# Patient Record
Sex: Female | Born: 2000 | Race: White | Hispanic: Yes | Marital: Single | Smoking: Never smoker
Health system: Southern US, Community
[De-identification: ages and names within clinical notes are randomized; demographics above are authoritative.]

## PROBLEM LIST (undated history)

## (undated) DIAGNOSIS — D649 Anemia, unspecified: Secondary | ICD-10-CM

## (undated) HISTORY — PX: NO PAST SURGERIES: SHX2092

---
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
## 2021-03-23 ENCOUNTER — Encounter: Payer: Self-pay | Admitting: General Surgery

## 2021-12-16 IMAGING — CT CT ABD-PELV W/ CM
2 of 4 series · 15 of 46 positions shown, 17 images · IV contrast (APPLIED)
Comparison: 09/25/2020 CT abdomen/pelvis.

CLINICAL DATA: Inpatient. Right lower quadrant pain. Suspected
perforated acute appendicitis on prior scan. Follow-up abscess.

EXAM:
CT ABDOMEN AND PELVIS WITH CONTRAST
TECHNIQUE: Multidetector CT imaging of the abdomen and pelvis was performed
using the standard protocol following bolus administration of
intravenous contrast.
CONTRAST:  100mL OMNIPAQUE IOHEXOL 300 MG/ML  SOLN

[Series 2: routine abd/pel with · axial · 0.88mm/px · z∈[-891,-501]mm · 12 of 92 slices shown, 14 images]
[im 7/92  soft-tissue]
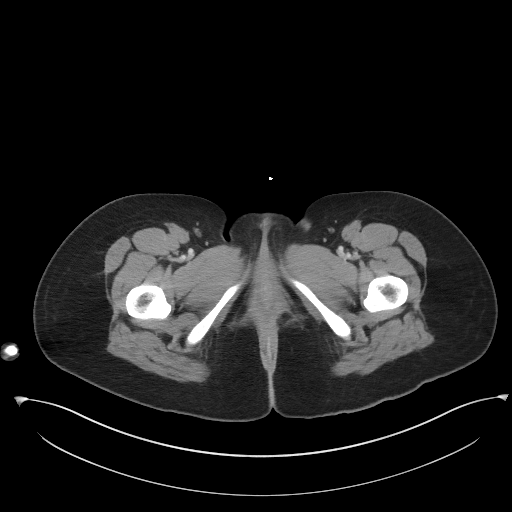
[im 7/92  bone]
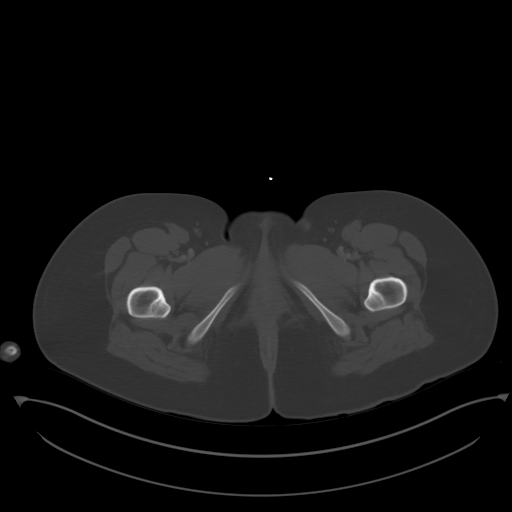
[im 13/92  soft-tissue]
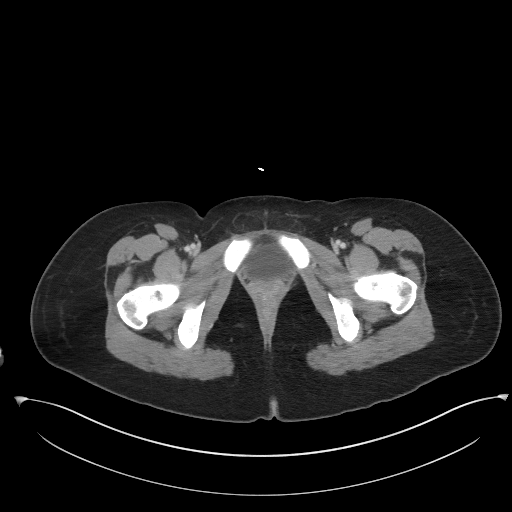
[im 22/92  soft-tissue]
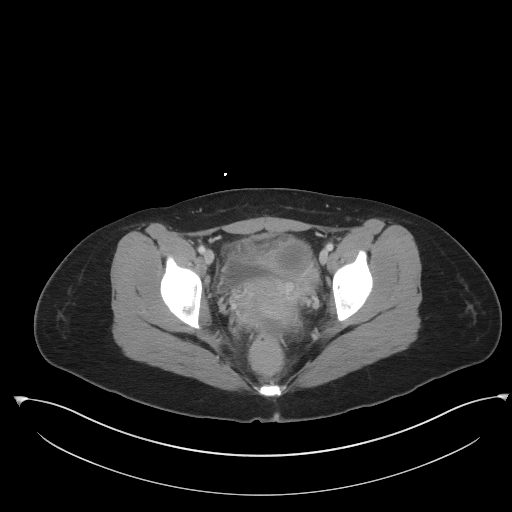
[im 28/92  soft-tissue]
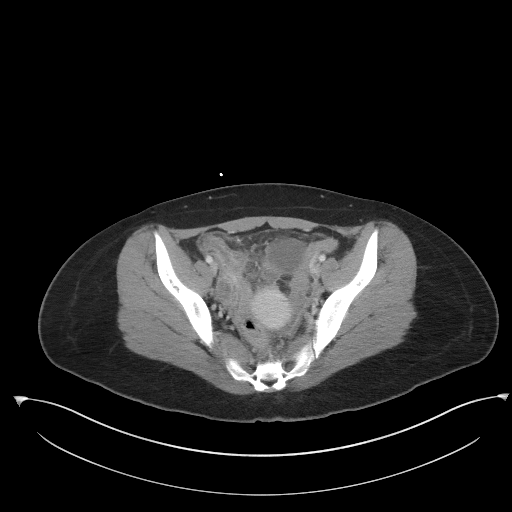
[im 34/92  soft-tissue]
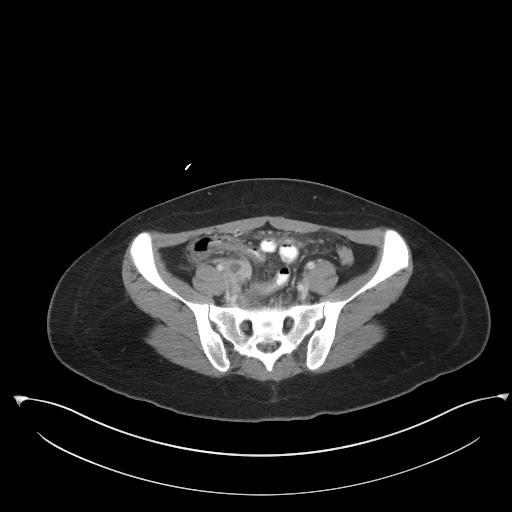
[im 43/92  soft-tissue]
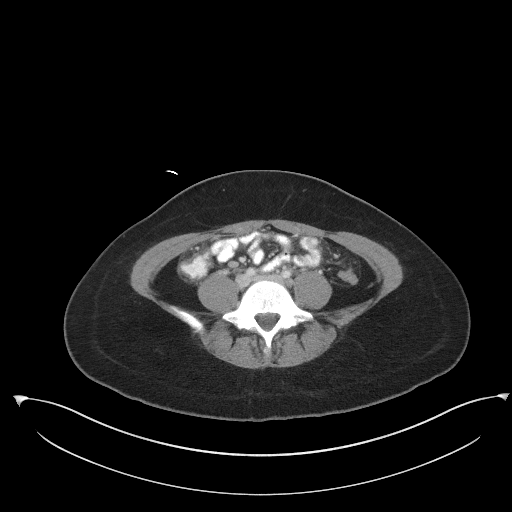
[im 49/92  soft-tissue]
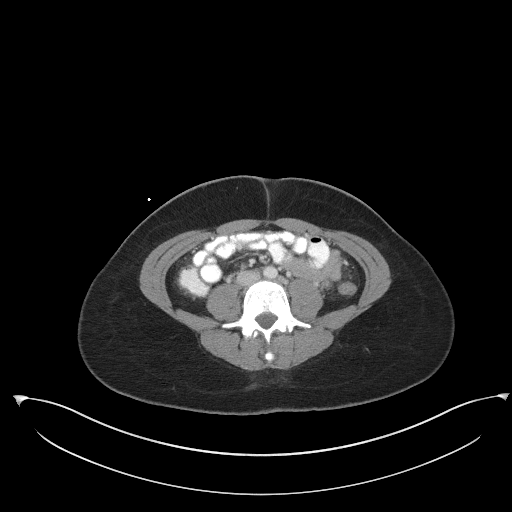
[im 58/92  soft-tissue]
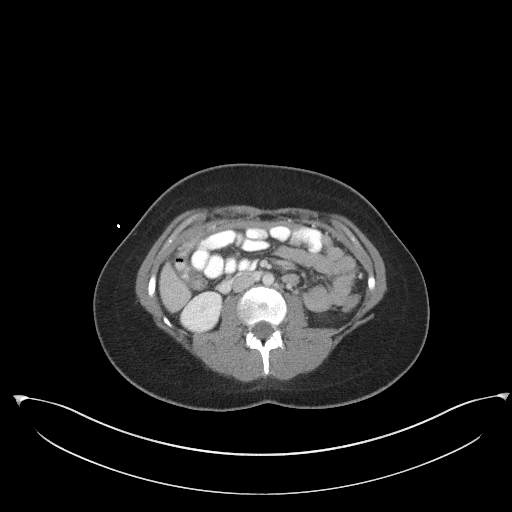
[im 64/92  soft-tissue]
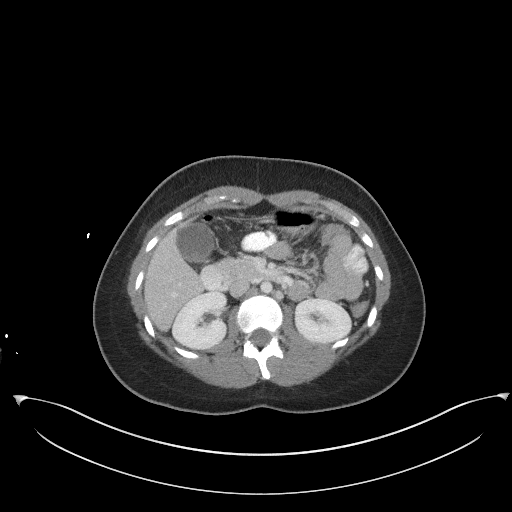
[im 64/92  bone]
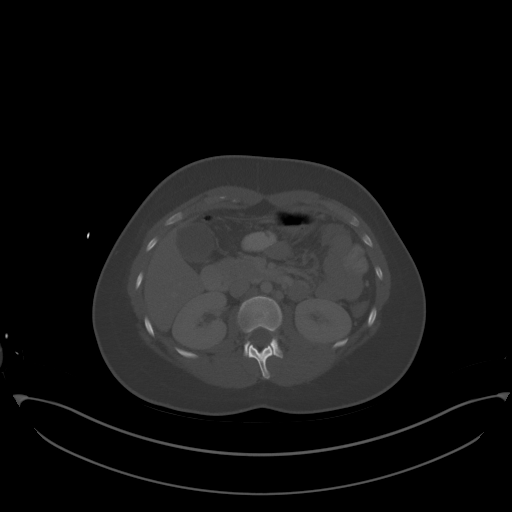
[im 70/92  soft-tissue]
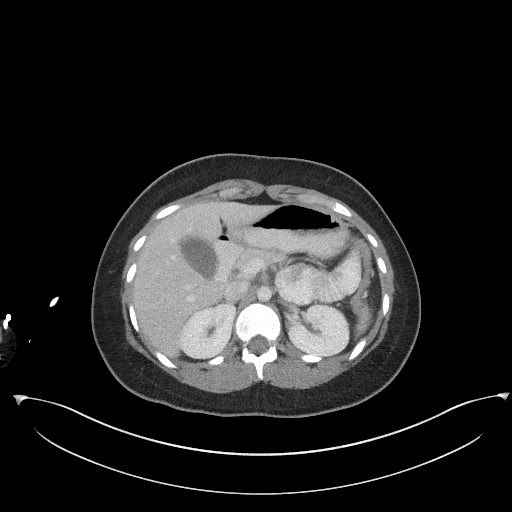
[im 79/92  soft-tissue]
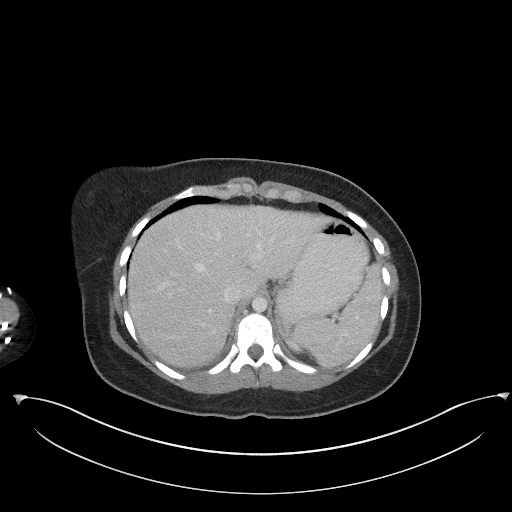
[im 85/92  soft-tissue]
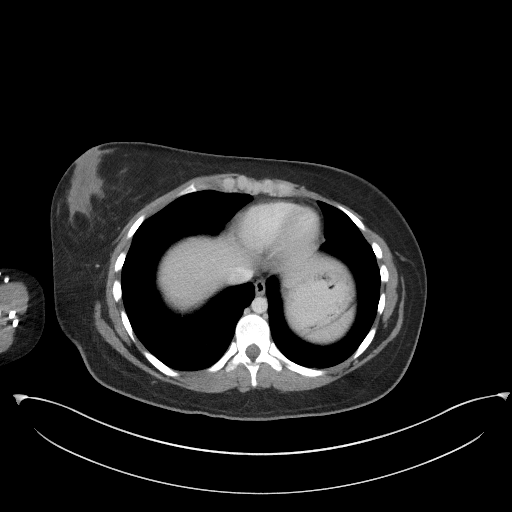

[Series 5: coronal st · coronal · 0.71mm/px · 3 of 86 slices shown]
[im 29/86  soft-tissue]
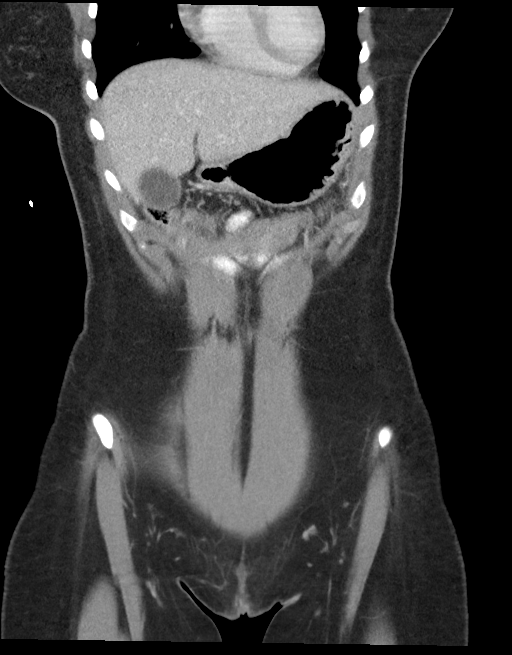
[im 38/86  soft-tissue]
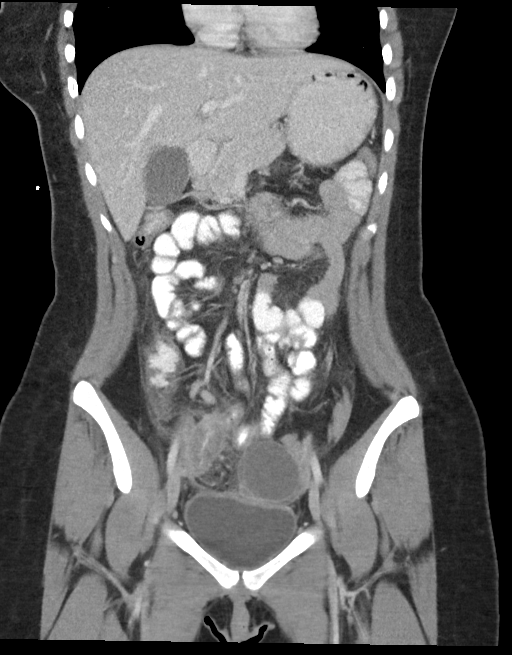
[im 48/86  soft-tissue]
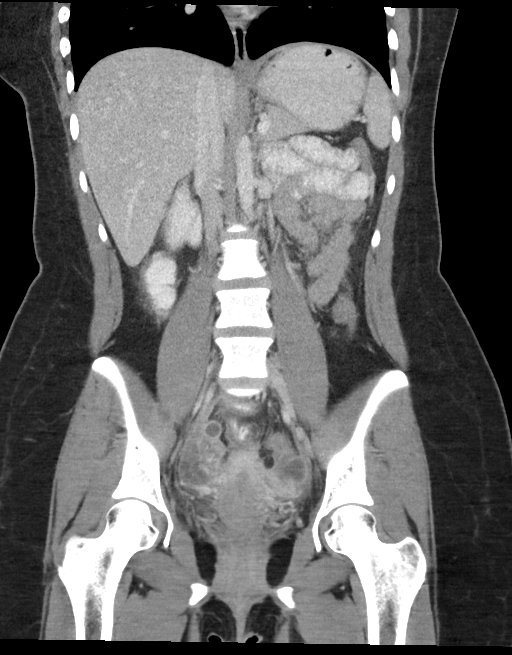

[15 of 46 positions shown; findings below may reference images not displayed]

FINDINGS: Lower chest: No significant pulmonary nodules or acute consolidative
airspace disease.

Hepatobiliary: Normal liver size. No liver mass. Normal gallbladder
with no radiopaque cholelithiasis. No biliary ductal dilatation.

Pancreas: Normal, with no mass or duct dilation.

Spleen: Normal size. No mass.

Adrenals/Urinary Tract: Normal adrenals. Normal kidneys with no
hydronephrosis and no renal mass. Normal bladder.

Stomach/Bowel: Normal non-distended stomach. No dilated small bowel
loops. Oral contrast transits to the right colon. Redemonstrated is
a dilated (13 mm diameter) tubular structure in the right lower
quadrant with indistinct thick enhancing wall and prominent
surrounding fat stranding, compatible with acute appendicitis, not
substantially changed. No free air. Reactive wall thickening within
multiple pelvic small bowel loops and throughout the cecum.
Otherwise unremarkable large bowel.

Vascular/Lymphatic: Normal caliber abdominal aorta. Patent portal,
splenic, hepatic and renal veins. No pathologically enlarged lymph
nodes in the abdomen or pelvis.

Reproductive: Grossly normal wall retroflexed uterus. No right
adnexal masses. Left adnexal 3.9 x 3.3 cm cyst (series 2/image 68),
minimally decreased from 4.2 x 3.7 cm on 09/25/2020 CT.

Other: No pneumoperitoneum. Pelvic cul-de-sac 5.8 x 2.6 cm
collection with thick enhancing wall (series 2/image 69), previously
5.5 x 2.6 cm, not substantially changed.

Musculoskeletal: No aggressive appearing focal osseous lesions.
IMPRESSION: 1. Persistent findings of acute appendicitis in the right lower
quadrant with prominent surrounding phlegmonous change including
reactive wall thickening in the pelvic small bowel and cecum.
Appendiceal wall is indistinct, suggesting ruptured appendicitis, as
previously suggested.
2. Pelvic cul-de-sac 5.8 x 2.6 cm collection with thick enhancing
wall, not substantially changed since 09/25/2020 CT, suspicious for
abscess.
3. Left adnexal 3.9 cm cyst, minimally decreased from 4.2 cm on
09/25/2020 CT.

## 2022-05-27 ENCOUNTER — Other Ambulatory Visit: Payer: Self-pay

## 2022-05-27 ENCOUNTER — Emergency Department
Admission: EM | Admit: 2022-05-27 | Discharge: 2022-05-27 | Disposition: A | Discharge status: Home / Self Care | Payer: Medicaid Other | Attending: Emergency Medicine | Admitting: Emergency Medicine

## 2022-05-27 ENCOUNTER — Emergency Department: Payer: Medicaid Other

## 2022-05-27 DIAGNOSIS — N200 Calculus of kidney: Secondary | ICD-10-CM | POA: Insufficient documentation

## 2022-05-27 DIAGNOSIS — R109 Unspecified abdominal pain: Secondary | ICD-10-CM | POA: Diagnosis present

## 2022-05-27 LAB — CBC
HCT: 42.2 % (ref 36.0–46.0)
Hemoglobin: 14.1 g/dL (ref 12.0–15.0)
MCH: 30.4 pg (ref 26.0–34.0)
MCHC: 33.4 g/dL (ref 30.0–36.0)
MCV: 90.9 fL (ref 80.0–100.0)
Platelets: 236 10*3/uL (ref 150–400)
RBC: 4.64 MIL/uL (ref 3.87–5.11)
RDW: 12.5 % (ref 11.5–15.5)
WBC: 10.8 10*3/uL — ABNORMAL HIGH (ref 4.0–10.5)
nRBC: 0 % (ref 0.0–0.2)

## 2022-05-27 LAB — URINALYSIS, COMPLETE (UACMP) WITH MICROSCOPIC
Bacteria, UA: NONE SEEN
Bilirubin Urine: NEGATIVE
Glucose, UA: NEGATIVE mg/dL
Ketones, ur: NEGATIVE mg/dL
Leukocytes,Ua: NEGATIVE
Nitrite: NEGATIVE
Protein, ur: NEGATIVE mg/dL
RBC / HPF: >50 RBC/hpf — ABNORMAL HIGH (ref 0–5)
Specific Gravity, Urine: 1.024 (ref 1.005–1.030)
pH: 7 (ref 5.0–8.0)

## 2022-05-27 LAB — URINALYSIS, ROUTINE W REFLEX MICROSCOPIC
Bilirubin Urine: NEGATIVE
Glucose, UA: NEGATIVE mg/dL
Ketones, ur: NEGATIVE mg/dL
Nitrite: NEGATIVE
Protein, ur: NEGATIVE mg/dL
Specific Gravity, Urine: 1.023 (ref 1.005–1.030)
Squamous Epithelial / HPF: >50 — ABNORMAL HIGH (ref 0–5)
pH: 5 (ref 5.0–8.0)

## 2022-05-27 LAB — COMPREHENSIVE METABOLIC PANEL
ALT: 14 U/L (ref 0–44)
AST: 25 U/L (ref 15–41)
Albumin: 4.1 g/dL (ref 3.5–5.0)
Alkaline Phosphatase: 65 U/L (ref 38–126)
Anion gap: 10 (ref 5–15)
BUN: 14 mg/dL (ref 6–20)
CO2: 24 mmol/L (ref 22–32)
Calcium: 9.3 mg/dL (ref 8.9–10.3)
Chloride: 107 mmol/L (ref 98–111)
Creatinine, Ser: 0.74 mg/dL (ref 0.44–1.00)
GFR, Estimated: >60 mL/min (ref >60)
Glucose, Bld: 125 mg/dL — ABNORMAL HIGH (ref 70–99)
Potassium: 3.8 mmol/L (ref 3.5–5.1)
Sodium: 141 mmol/L (ref 135–145)
Total Bilirubin: 0.4 mg/dL (ref 0.3–1.2)
Total Protein: 8 g/dL (ref 6.5–8.1)

## 2022-05-27 LAB — LIPASE, BLOOD: Lipase: 35 U/L (ref 11–51)

## 2022-05-27 LAB — POC URINE PREG, ED: Preg Test, Ur: NEGATIVE

## 2022-05-27 LAB — HCG, QUANTITATIVE, PREGNANCY: hCG, Beta Chain, Quant, S: 1 m[IU]/mL (ref <5)

## 2022-05-27 MED ORDER — HYDROCODONE-ACETAMINOPHEN 5-325 MG PO TABS: 1.0000 | ORAL_TABLET | Freq: Four times a day (QID) | ORAL | 0 refills | Status: DC | PRN

## 2022-05-27 MED ORDER — KETOROLAC TROMETHAMINE 15 MG/ML IJ SOLN
15.0000 mg | Freq: Once | INTRAMUSCULAR | Status: AC
  Administered 2022-05-27: 15 mg via INTRAVENOUS
  Filled 2022-05-27: qty 1

## 2022-05-27 MED ORDER — ACETAMINOPHEN 500 MG PO TABS
1000.0000 mg | ORAL_TABLET | Freq: Once | ORAL | Status: AC
  Administered 2022-05-27: 1000 mg via ORAL
  Filled 2022-05-27: qty 2

## 2022-05-27 MED ORDER — HYDROCODONE-ACETAMINOPHEN 5-325 MG PO TABS
1.0000 | ORAL_TABLET | Freq: Once | ORAL | Status: AC
  Administered 2022-05-27: 1 via ORAL
  Filled 2022-05-27: qty 1

## 2022-05-27 MED ORDER — SODIUM CHLORIDE 0.9 % IV BOLUS
1000.0000 mL | Freq: Once | INTRAVENOUS | Status: AC
  Administered 2022-05-27: 1000 mL via INTRAVENOUS

## 2022-05-27 MED ORDER — MORPHINE SULFATE (PF) 4 MG/ML IV SOLN
4.0000 mg | Freq: Once | INTRAVENOUS | Status: AC
  Administered 2022-05-27: 4 mg via INTRAVENOUS
  Filled 2022-05-27: qty 1

## 2022-05-27 MED ORDER — ONDANSETRON 4 MG PO TBDP: 4.0000 mg | ORAL_TABLET | Freq: Four times a day (QID) | ORAL | 1 refills | Status: DC | PRN

## 2022-05-27 MED ORDER — ONDANSETRON HCL 4 MG/2ML IJ SOLN
4.0000 mg | Freq: Once | INTRAMUSCULAR | Status: AC
  Administered 2022-05-27: 4 mg via INTRAVENOUS
  Filled 2022-05-27: qty 2

## 2022-05-27 NOTE — Discharge Instructions (Addendum)
You have been seen in the Emergency Department (ED) today for pain that we believe based on your workup, is caused by kidney stones.  As we have discussed, please drink plenty of fluids.  Please make a follow up appointment with the physician(s) listed elsewhere in this documentation. ° °You may take pain medication as needed but ONLY as prescribed.   We also recommend that you take over-the-counter ibuprofen regularly according to label instructions over the next 5 days.  Take it with meals to minimize stomach discomfort. ° °Please see your doctor as soon as possible as stones may take 1-3 weeks to pass and you may require additional care or medications. ° °Do not drink alcohol, drive or participate in any other potentially dangerous activities while taking opiate pain medication as it may make you sleepy. Do not take this medication with any other sedating medications, either prescription or over-the-counter. If you were prescribed Percocet or Vicodin, do not take these with acetaminophen (Tylenol) as it is already contained within these medications. °  °This medication is an opiate (or narcotic) pain medication and can be habit forming.  Use it as little as possible to achieve adequate pain control.  Do not use or use it with extreme caution if you have a history of opiate abuse or dependence.  If you are on a pain contract with your primary care doctor or a pain specialist, be sure to let them know you were prescribed this medication today from the [facility].  This medication is intended for your use only - do not give any to anyone else and keep it in a secure place where nobody else, especially children, have access to it.  It will also cause or worsen constipation, so you may want to consider taking an over-the-counter stool softener while you are taking this medication. ° °Return to the Emergency Department (ED) or call your doctor if you have any worsening pain, fever, painful  urination, are unable to urinate, or develop other symptoms that concern you. ° °

## 2022-05-27 NOTE — ED Provider Notes (Signed)
[facility] Provider Note   Event Date/Time   First MD Initiated Contact with Patient 05/27/22 1129     (approximate)  History   Abdominal Pain  HPI  Kimberly Park is a 21 y.o. female reports no major past medical history other than an appendectomy about 1 year ago.  Takes no medications no allergies to medications denies pregnancy  This morning about 8 AM suddenly had an abrupt pain in her left flank and urge to urinate.  Reports she is felt like she needed to urinate about 20 times since then is having of severe sharp pain does not seem to want to go away in the left flank region.  She reports her family has a strong history of family Of kidney stones but she does not  No pelvic pain or vaginal bleeding.  She reports the pain is more in her left back and flank area that is severe although relieved slightly by medication she was given at triage  Has had vomiting, she reports it hurts so bad is been causing her to throw up  No fevers or chills     Physical Exam   Triage Vital Signs: ED Triage Vitals  Enc Vitals Group     BP 05/27/22 1041 (!) 111/98     Pulse Rate 05/27/22 1039 97     Resp 05/27/22 1039 (!) 22     Temp 05/27/22 1039 97.6 F (36.4 C)     Temp Source 05/27/22 1039 Oral     SpO2 05/27/22 1039 100 %     Weight 05/27/22 1134 143 lb 4.8 oz (65 kg)     Height 05/27/22 1041 5' (1.524 m)     Head Circumference --      Peak Flow --      Pain Score 05/27/22 1041 10     Pain Loc --      Pain Edu? --      Excl. in GC? --     Most recent vital signs: Vitals:   05/27/22 1135 05/27/22 1454  BP: 120/88 122/80  Pulse: 90 88  Resp: 20 20  Temp:  98 F (36.7 C)  SpO2: 99% 99%     General: Awake, no distress.  CV:  Good peripheral perfusion.  Normal heart tones Resp:  Normal effort.  Clear bilateral Abd:  No distention.  Mild tenderness to palpation in the left flank.  No rebound or guarding.  Moderate to severe left  costovertebral angle tenderness to percussion. Other:     ED Results / Procedures / Treatments   Labs (all labs ordered are listed, but only abnormal results are displayed) Labs Reviewed  COMPREHENSIVE METABOLIC PANEL - Abnormal; Notable for the following components:      Result Value   Glucose, Bld 125 (*)    All other components within normal limits  CBC - Abnormal; Notable for the following components:   WBC 10.8 (*)    All other components within normal limits  URINALYSIS, ROUTINE W REFLEX MICROSCOPIC - Abnormal; Notable for the following components:   Color, Urine YELLOW (*)    APPearance CLOUDY (*)    Hgb urine dipstick MODERATE (*)    Leukocytes,Ua TRACE (*)    Bacteria, UA RARE (*)    Squamous Epithelial / LPF >50 (*)    All other components within normal limits  URINALYSIS, COMPLETE (UACMP) WITH MICROSCOPIC - Abnormal; Notable for the following components:   Color, Urine YELLOW (*)  APPearance CLEAR (*)    Hgb urine dipstick MODERATE (*)    RBC / HPF >50 (*)    All other components within normal limits  URINE CULTURE  LIPASE, BLOOD  HCG, QUANTITATIVE, PREGNANCY  POC URINE PREG, ED     EKG     RADIOLOGY  CT imaging reviewed by me for acute gross abnormality, noted is a small amount of left-sided hydronephrosis and a tiny calcification near the bladder  CT Renal Stone Study  Result Date: 05/27/2022 CLINICAL DATA:  Abdominal/flank pain on the left. EXAM: CT ABDOMEN AND PELVIS WITHOUT CONTRAST TECHNIQUE: Multidetector CT imaging of the abdomen and pelvis was performed following the standard protocol without IV contrast. RADIATION DOSE REDUCTION: This exam was performed according to the departmental dose-optimization program which includes automated exposure control, adjustment of the mA and/or kV according to patient size and/or use of iterative reconstruction technique. COMPARISON:  CT abdomen pelvis dated 11/18/2020 FINDINGS: Lower chest: No acute  abnormality. Hepatobiliary: No focal liver abnormality is seen. No gallstones, gallbladder wall thickening, or biliary dilatation. Pancreas: Unremarkable. No pancreatic ductal dilatation or surrounding inflammatory changes. Spleen: Normal in size without focal abnormality. Adrenals/Urinary Tract: There is a 3 mm calcification in the pelvis in the expected location of the distal left ureter (series 2, image 71 and series 5, image 62). There is mild left hydroureteronephrosis. A 10 x 4 mm calcification near the right psoas muscle is favored to be an appendicolith as opposed to a ureteral calculus (series 2, image 55 series 5 images 57-59). The right kidney appears normal and there is no hydronephrosis on the right. The urinary bladder appears normal. Stomach/Bowel: Stomach is within normal limits. Appendix appears normal. No evidence of bowel wall thickening, distention, or inflammatory changes. Vascular/Lymphatic: No significant vascular findings are present. No enlarged abdominal or pelvic lymph nodes. Reproductive: Uterus and bilateral adnexa are unremarkable. Other: There is a small amount of free fluid in the pelvis. No abdominal wall hernia is identified. Musculoskeletal: No acute or significant osseous findings. IMPRESSION: 1. A 3 mm calcification in the pelvis in the expected location of the distal left ureter with mild left hydroureteronephrosis. This is favored to reflect an obstructive distal left ureteral calculus. 2. Small amount of free fluid in the pelvis may be physiologic. Electronically Signed   By: Romona Curls M.D.   On: 05/27/2022 12:02      PROCEDURES:  Critical Care performed: No  Procedures   MEDICATIONS ORDERED IN ED: Medications  sodium chloride 0.9 % bolus 1,000 mL (0 mLs Intravenous Stopped 05/27/22 1447)  ondansetron (ZOFRAN) injection 4 mg (4 mg Intravenous Given 05/27/22 1051)  acetaminophen (TYLENOL) tablet 1,000 mg (1,000 mg Oral Given 05/27/22 1052)  ketorolac  (TORADOL) 15 MG/ML injection 15 mg (15 mg Intravenous Given 05/27/22 1141)  morphine (PF) 4 MG/ML injection 4 mg (4 mg Intravenous Given 05/27/22 1141)  HYDROcodone-acetaminophen (NORCO/VICODIN) 5-325 MG per tablet 1 tablet (1 tablet Oral Given 05/27/22 1332)  HYDROcodone-acetaminophen (NORCO/VICODIN) 5-325 MG per tablet 1 tablet (1 tablet Oral Given 05/27/22 1445)  ketorolac (TORADOL) 15 MG/ML injection 15 mg (15 mg Intravenous Given 05/27/22 1445)     IMPRESSION / MDM / ASSESSMENT AND PLAN / ED COURSE  I reviewed the triage vital signs and the nursing notes.                              Differential diagnosis includes but is not limited to,  abdominal perforation, aortic dissection, cholecystitis, appendicitis, diverticulitis, colitis, esophagitis/gastritis, kidney stone, pyelonephritis, urinary tract infection, aortic aneurysm. All are considered in decision and treatment plan. Based upon the patient's presentation and risk factors, suspect this may be acute nephrolithiasis or urologic process based on the clinical history, urinary urgency, severe left flank pain and costovertebral angle tenderness.  After pain control we will proceed with CT imaging.  Initial urine sample was quite contaminated, patient reports she did not catch a midstream sample.  Will have her trial attempt to catch a midstream cleaner sample for analysis.  Repeat urinalysis with RBCs but no evidence of infection   Patient's presentation is most consistent with acute complicated illness / injury requiring diagnostic workup.  Labs notable for very mild leukocytosis.  Normal comprehensive metabolic panel.  Negative pregnancy test.  ----------------------------------------- 1:19 PM on 05/27/2022 ----------------------------------------- Patient's pain much better controlled.  She is now ambulating able to give a second urine sample for analysis.  Discussed with her the diagnosis treatment follow-up with urology and  careful return and treatment precautions which she is agreeable with.  Her family will be driving her home she understands not to drive today or while taking hydrocodone Clinical Course as of 05/27/22 1528  Fri May 27, 2022  1440 Patient having increasing and recurrent pain, now appears to be screaming out reporting pain in the left flank area.  Pain is presently not well-controlled.  Will provide additional hydrocodone and additional dose of Toradol [MQ]    Clinical Course User Index [MQ] Sharyn Creamer, MD     FINAL CLINICAL IMPRESSION(S) / ED DIAGNOSES   Final diagnoses:  Kidney stone on left side     Rx / DC Orders   ED Discharge Orders          Ordered    HYDROcodone-acetaminophen (NORCO/VICODIN) 5-325 MG tablet  Every 6 hours PRN        05/27/22 1524    ondansetron (ZOFRAN-ODT) 4 MG disintegrating tablet  Every 6 hours PRN        05/27/22 1524             Note:  This document was prepared using Dragon voice recognition software and may include unintentional dictation errors.   Sharyn Creamer, MD 05/27/22 [phone]

## 2022-05-27 NOTE — ED Triage Notes (Addendum)
Pt to ED via POV from home. Pt reports left sided abdominal pain that started this morning. Pt also reports difficulty urinating and pain. Pt reports appendix removed last year. Pt tearful and guarding left side. Pt unable to sit still in triage due to pain.

## 2022-05-29 LAB — URINE CULTURE: Culture: <10000 — AB

## 2022-06-03 ENCOUNTER — Ambulatory Visit: Payer: Medicaid Other | Admitting: Urology

## 2022-06-03 ENCOUNTER — Encounter: Payer: Self-pay | Admitting: Urology

## 2022-06-03 ENCOUNTER — Ambulatory Visit
Admission: RE | Admit: 2022-06-03 | Discharge: 2022-06-03 | Disposition: A | Discharge status: Home / Self Care | Payer: Medicaid Other | Source: Ambulatory Visit | Attending: Urology | Admitting: Urology

## 2022-06-03 VITALS — BP 104/68 | HR 83 | Ht 60.0 in | Wt 141.0 lb

## 2022-06-03 DIAGNOSIS — N201 Calculus of ureter: Secondary | ICD-10-CM

## 2022-06-03 DIAGNOSIS — N200 Calculus of kidney: Secondary | ICD-10-CM | POA: Insufficient documentation

## 2022-06-03 LAB — URINALYSIS, COMPLETE
Bilirubin, UA: NEGATIVE
Glucose, UA: NEGATIVE
Ketones, UA: NEGATIVE
Leukocytes,UA: NEGATIVE
Nitrite, UA: NEGATIVE
Protein,UA: NEGATIVE
Specific Gravity, UA: 1.01 (ref 1.005–1.030)
Urobilinogen, Ur: 0.2 mg/dL (ref 0.2–1.0)
pH, UA: 5.5 (ref 5.0–7.5)

## 2022-06-03 LAB — MICROSCOPIC EXAMINATION

## 2022-06-03 MED ORDER — TAMSULOSIN HCL 0.4 MG PO CAPS: 0.4000 mg | ORAL_CAPSULE | Freq: Every day | ORAL | 0 refills | Status: DC

## 2022-06-03 NOTE — Progress Notes (Unsigned)
06/03/2022 12:01 PM   Kimberly Park [birth year] [id number]  Referring provider: [facility] [address] [place],  [place] [address]  Chief Complaint  Patient presents with   Nephrolithiasis    HPI: Kimberly Park is a 21 y.o. female who presents for follow-up of recent ED visit for renal colic.  Presented to [facility] ED 05/27/2022 with acute onset of severe left flank pain associated with urinary urgency.  No identifiable precipitating, aggravating or alleviating factors + nausea/vomiting; no fever or chills Stone protocol CT showed a 3 mm left distal ureteral calculus with mild hydronephrosis Symptoms were controlled with parenteral analgesics and antiemetics She was discharged on hydrocodone and Zofran Since her ED visit having mild discomfort but it is fairly certain the stone is still there No previous history of stone disease   PMH: Past Medical History:  Diagnosis Date   Anemia    Appendicitis with nonoperative management 09/23/2020    Surgical History: Past Surgical History:  Procedure Laterality Date   LAPAROSCOPIC APPENDECTOMY N/A 11/20/2020   Procedure: APPENDECTOMY LAPAROSCOPIC, interval;  Surgeon: Duanne Guess, MD;  Location: [facility] ORS;  Service: General;  Laterality: N/A;   NO PAST SURGERIES      Home Medications:  Allergies as of 06/03/2022   No Known Allergies      Medication List        Accurate as of June 03, 2022 12:01 PM. If you have any questions, ask your nurse or doctor.          HYDROcodone-acetaminophen 5-325 MG tablet Commonly known as: NORCO/VICODIN Take 1-2 tablets by mouth every 6 (six) hours as needed for moderate pain or severe pain.   ibuprofen 800 MG tablet Commonly known as: ADVIL Take 1 tablet (800 mg total) by mouth every 8 (eight) hours as needed.   ondansetron 4 MG disintegrating tablet Commonly known as: ZOFRAN-ODT Take 1 tablet (4 mg total) by mouth every 6 (six) hours as  needed for nausea or vomiting.   tamsulosin 0.4 MG Caps capsule Commonly known as: FLOMAX Take 1 capsule (0.4 mg total) by mouth daily. Started by: Riki Altes, MD        Allergies: No Known Allergies  Family History: Family History  Problem Relation Age of Onset   Diabetes Maternal Grandmother     Social History:  reports that she has never smoked. She has never used smokeless tobacco. She reports that she does not drink alcohol and does not use drugs.   Physical Exam: BP 104/68   Pulse 83   Ht 5' (1.524 m)   Wt 141 lb (64 kg)   LMP 05/20/2022   BMI 27.54 kg/m   Constitutional:  Alert and oriented, No acute distress. HEENT:  AT Respiratory: Normal respiratory effort, no increased work of breathing. Psychiatric: Normal mood and affect.  Laboratory Data:  Urinalysis Dipstick/microscopy negative   Pertinent Imaging: CT images were personally reviewed and interpreted  CT Renal Stone Study  Narrative CLINICAL DATA:  Abdominal/flank pain on the left.  EXAM: CT ABDOMEN AND PELVIS WITHOUT CONTRAST  TECHNIQUE: Multidetector CT imaging of the abdomen and pelvis was performed following the standard protocol without IV contrast.  RADIATION DOSE REDUCTION: This exam was performed according to the departmental dose-optimization program which includes automated exposure control, adjustment of the mA and/or kV according to patient size and/or use of iterative reconstruction technique.  COMPARISON:  CT abdomen pelvis dated 11/18/2020  FINDINGS: Lower chest: No acute abnormality.  Hepatobiliary:  No focal liver abnormality is seen. No gallstones, gallbladder wall thickening, or biliary dilatation.  Pancreas: Unremarkable. No pancreatic ductal dilatation or surrounding inflammatory changes.  Spleen: Normal in size without focal abnormality.  Adrenals/Urinary Tract: There is a 3 mm calcification in the pelvis in the expected location of the distal left  ureter (series 2, image 71 and series 5, image 62). There is mild left hydroureteronephrosis. A 10 x 4 mm calcification near the right psoas muscle is favored to be an appendicolith as opposed to a ureteral calculus (series 2, image 55 series 5 images 57-59). The right kidney appears normal and there is no hydronephrosis on the right. The urinary bladder appears normal.  Stomach/Bowel: Stomach is within normal limits. Appendix appears normal. No evidence of bowel wall thickening, distention, or inflammatory changes.  Vascular/Lymphatic: No significant vascular findings are present. No enlarged abdominal or pelvic lymph nodes.  Reproductive: Uterus and bilateral adnexa are unremarkable.  Other: There is a small amount of free fluid in the pelvis. No abdominal wall hernia is identified.  Musculoskeletal: No acute or significant osseous findings.  IMPRESSION: 1. A 3 mm calcification in the pelvis in the expected location of the distal left ureter with mild left hydroureteronephrosis. This is favored to reflect an obstructive distal left ureteral calculus. 2. Small amount of free fluid in the pelvis may be physiologic.   Electronically Signed By: Romona Curls M.D. On: 05/27/2022 12:02   Assessment & Plan:    1.  Left distal ureteral calculus Mild persistent symptoms KUB ordered today Rx tamsulosin 0.4 mg in pharmacy Strain urine Call for worsening pain; follow-up 2 weeks If calculus is visualized on KUB we will obtain a follow-up KUB otherwise may need a follow-up pelvic CT ***2-week follow-up appointment  Riki Altes, MD  [facility] [address], [address] [place], [place] [address] [phone]

## 2022-06-04 ENCOUNTER — Encounter: Payer: Self-pay | Admitting: Urology

## 2022-06-06 ENCOUNTER — Encounter: Payer: Self-pay | Admitting: *Deleted

## 2022-06-16 ENCOUNTER — Other Ambulatory Visit: Payer: Self-pay | Admitting: Urology

## 2022-06-16 DIAGNOSIS — N201 Calculus of ureter: Secondary | ICD-10-CM

## 2022-06-16 NOTE — Addendum Note (Signed)
Addended by: Levada Schilling on: 06/16/2022 12:05 PM   Modules accepted: Orders

## 2022-06-22 ENCOUNTER — Other Ambulatory Visit: Payer: Self-pay | Admitting: *Deleted

## 2022-06-22 DIAGNOSIS — N201 Calculus of ureter: Secondary | ICD-10-CM

## 2022-06-23 ENCOUNTER — Ambulatory Visit: Payer: Medicaid Other | Admitting: Urology

## 2022-06-28 ENCOUNTER — Ambulatory Visit: Payer: Medicaid Other

## 2022-07-01 ENCOUNTER — Ambulatory Visit
Admission: RE | Admit: 2022-07-01 | Discharge: 2022-07-01 | Disposition: A | Discharge status: Home / Self Care | Payer: Medicaid Other | Source: Ambulatory Visit | Attending: Urology | Admitting: Urology

## 2022-07-01 DIAGNOSIS — N201 Calculus of ureter: Secondary | ICD-10-CM | POA: Diagnosis present

## 2022-07-06 ENCOUNTER — Encounter: Payer: Self-pay | Admitting: *Deleted

## 2022-07-06 ENCOUNTER — Ambulatory Visit: Payer: Medicaid Other | Admitting: Urology

## 2022-07-06 ENCOUNTER — Encounter: Payer: Self-pay | Admitting: Urology

## 2022-07-06 VITALS — BP 99/67 | HR 92 | Ht 60.0 in | Wt 141.0 lb

## 2022-07-06 DIAGNOSIS — R3129 Other microscopic hematuria: Secondary | ICD-10-CM

## 2022-07-06 DIAGNOSIS — R109 Unspecified abdominal pain: Secondary | ICD-10-CM

## 2022-07-06 DIAGNOSIS — Z87442 Personal history of urinary calculi: Secondary | ICD-10-CM | POA: Diagnosis not present

## 2022-07-06 LAB — URINALYSIS, COMPLETE
Bilirubin, UA: NEGATIVE
Glucose, UA: NEGATIVE
Ketones, UA: NEGATIVE
Leukocytes,UA: NEGATIVE
Nitrite, UA: NEGATIVE
Specific Gravity, UA: 1.03 (ref 1.005–1.030)
Urobilinogen, Ur: 0.2 mg/dL (ref 0.2–1.0)
pH, UA: 6 (ref 5.0–7.5)

## 2022-07-06 LAB — MICROSCOPIC EXAMINATION

## 2022-07-06 MED ORDER — TAMSULOSIN HCL 0.4 MG PO CAPS: 0.4000 mg | ORAL_CAPSULE | Freq: Every day | ORAL | 0 refills | Status: AC

## 2022-07-06 NOTE — Progress Notes (Signed)
   07/06/2022 1:05 PM   Kimberly Park [id number]  Referring provider: [facility] [address],  [place] [address]   HPI: 22 y.o. female who presents for follow-up visit.  Initially seen 06/03/2022 Since that visit has had intermittent left flank pain.  Her urinary frequency/urgency has resolved A repeat pelvic CT 07/01/2022 showed absence of the previously noted distal calculus Ran out of tamsulosin   PMH: Past Medical History:  Diagnosis Date   Anemia    Appendicitis with nonoperative management 09/23/2020    Surgical History: Past Surgical History:  Procedure Laterality Date   LAPAROSCOPIC APPENDECTOMY N/A 11/20/2020   Procedure: APPENDECTOMY LAPAROSCOPIC, interval;  Surgeon: Kimberly Maudlin, MD;  Location: [facility] ORS;  Service: General;  Laterality: N/A;   NO PAST SURGERIES      Home Medications:  Allergies as of 07/06/2022   No Known Allergies      Medication List        Accurate as of July 06, 2022  1:05 PM. If you have any questions, ask your nurse or doctor.          HYDROcodone-acetaminophen 5-325 MG tablet Commonly known as: NORCO/VICODIN Take 1-2 tablets by mouth every 6 (six) hours as needed for moderate pain or severe pain.   ibuprofen 800 MG tablet Commonly known as: ADVIL Take 1 tablet (800 mg total) by mouth every 8 (eight) hours as needed.   ondansetron 4 MG disintegrating tablet Commonly known as: ZOFRAN-ODT Take 1 tablet (4 mg total) by mouth every 6 (six) hours as needed for nausea or vomiting.   tamsulosin 0.4 MG Caps capsule Commonly known as: FLOMAX Take 1 capsule (0.4 mg total) by mouth daily.        Allergies: No Known Allergies  Family History: Family History  Problem Relation Age of Onset   Diabetes Maternal Grandmother     Social History:  reports that she has never smoked. She has never used smokeless tobacco. She reports that she does not drink alcohol and does not use  drugs.   Physical Exam: BP 99/67   Pulse 92   Ht 5' (1.524 m)   Wt 141 lb (64 kg)   LMP 05/20/2022 (Exact Date) Comment: signed preg test waiver on 07/01/22 for CT Scan  BMI 27.54 kg/m   Constitutional:  Alert and oriented, No acute distress. HEENT: [place] AT Respiratory: Normal respiratory effort, no increased work of breathing. Psychiatric: Normal mood and affect.  Laboratory Data:  Urinalysis Microscopy 3-10 RBC   Pertinent Imaging: Pelvic CT images were personally reviewed and interpreted   Assessment & Plan:   We discussed her follow-up CT showed absence of the previously noted calculus We discussed although CT findings are very sensitive and they are not 100% and she does have microhematuria today Rx tamsulosin sent to pharmacy PA follow-up 1-2 weeks   Kimberly Park, [place] [address] [place], [place] [address] [phone]

## 2022-07-13 ENCOUNTER — Ambulatory Visit: Payer: Medicaid Other | Admitting: Physician Assistant

## 2022-07-13 ENCOUNTER — Encounter: Payer: Self-pay | Admitting: Physician Assistant

## 2022-07-13 VITALS — BP 104/68 | HR 97 | Ht 60.0 in | Wt 141.0 lb

## 2022-07-13 DIAGNOSIS — R3129 Other microscopic hematuria: Secondary | ICD-10-CM

## 2022-07-13 LAB — MICROSCOPIC EXAMINATION

## 2022-07-13 LAB — URINALYSIS, COMPLETE
Bilirubin, UA: NEGATIVE
Glucose, UA: NEGATIVE
Ketones, UA: NEGATIVE
Leukocytes,UA: NEGATIVE
Nitrite, UA: NEGATIVE
Protein,UA: NEGATIVE
Specific Gravity, UA: 1.03 (ref 1.005–1.030)
Urobilinogen, Ur: 0.2 mg/dL (ref 0.2–1.0)
pH, UA: 5.5 (ref 5.0–7.5)

## 2022-07-13 NOTE — Progress Notes (Signed)
07/13/2022 1:43 PM   Kimberly Park 2001/06/25 [id number]  CC: Chief Complaint  Patient presents with   Follow-up   HPI: Kimberly Park is a 22 y.o. female with a recent history of a 3 mm distal left ureteral stone with microscopic hematuria with evidence of stone resolution on follow-up CT pelvis dated 07/01/2022 who presents today for urine recheck.   Today she reports her symptoms have resolved and she has no acute concerns today.  She specifically denies flank pain and dysuria and she never saw a stone pass.  She is menstruating right now.  She is leaving to study abroad in [place] early next week and will return in May.  In-office UA today positive for 2+ blood; urine microscopy with 3-10 RBCs/HPF, and moderate bacteria.  PMH: Past Medical History:  Diagnosis Date   Anemia    Appendicitis with nonoperative management 09/23/2020    Surgical History: Past Surgical History:  Procedure Laterality Date   LAPAROSCOPIC APPENDECTOMY N/A 11/20/2020   Procedure: APPENDECTOMY LAPAROSCOPIC, interval;  Surgeon: Fredirick Maudlin, MD;  Location: [facility] ORS;  Service: General;  Laterality: N/A;   NO PAST SURGERIES      Home Medications:  Allergies as of 07/13/2022   No Known Allergies      Medication List        Accurate as of July 13, 2022  1:43 PM. If you have any questions, ask your nurse or doctor.          STOP taking these medications    HYDROcodone-acetaminophen 5-325 MG tablet Commonly known as: NORCO/VICODIN Stopped by: Debroah Loop, PA-C   ibuprofen 800 MG tablet Commonly known as: ADVIL Stopped by: Debroah Loop, PA-C   ondansetron 4 MG disintegrating tablet Commonly known as: ZOFRAN-ODT Stopped by: Debroah Loop, PA-C       TAKE these medications    tamsulosin 0.4 MG Caps capsule Commonly known as: FLOMAX Take 1 capsule (0.4 mg total) by mouth daily.        Allergies:  No Known Allergies  Family  History: Family History  Problem Relation Age of Onset   Diabetes Maternal Grandmother     Social History:   reports that she has never smoked. She has never used smokeless tobacco. She reports that she does not drink alcohol and does not use drugs.  Physical Exam: BP 104/68   Pulse 97   Ht 5' (1.524 m)   Wt 141 lb (64 kg)   LMP 05/20/2022 (Exact Date) Comment: signed preg test waiver on 07/01/22 for CT Scan  BMI 27.54 kg/m   Constitutional:  Alert and oriented, no acute distress, nontoxic appearing HEENT: [place], AT Cardiovascular: No clubbing, cyanosis, or edema Respiratory: Normal respiratory effort, no increased work of breathing Skin: No rashes, bruises or suspicious lesions Neurologic: Grossly intact, no focal deficits, moving all 4 extremities Psychiatric: Normal mood and affect  Laboratory Data: Results for orders placed or performed in visit on 07/13/22  Microscopic Examination   Urine  Result Value Ref Range   WBC, UA 0-5 0 - 5 /hpf   RBC, Urine 3-10 (A) 0 - 2 /hpf   Epithelial Cells (non renal) 0-10 0 - 10 /hpf   Mucus, UA Present (A) Not Estab.   Bacteria, UA Moderate (A) None seen/Few  Urinalysis, Complete  Result Value Ref Range   Specific Gravity, UA 1.030 1.005 - 1.030   pH, UA 5.5 5.0 - 7.5   Color, UA Yellow Yellow   Appearance  Ur Clear Clear   Leukocytes,UA Negative Negative   Protein,UA Negative Negative/Trace   Glucose, UA Negative Negative   Ketones, UA Negative Negative   RBC, UA 2+ (A) Negative   Bilirubin, UA Negative Negative   Urobilinogen, Ur 0.2 0.2 - 1.0 mg/dL   Nitrite, UA Negative Negative   Microscopic Examination See below:    Assessment & Plan:   1. Microhematuria Anticipate secondary to ureteral stone, and there is evidence of clearance on follow-up CT scan.  She does have some microscopic hematuria today, though she is menstruating.  I think her urine findings today are likely secondary to vaginal bleeding contamination.  I offered  her repeat UA next week versus catheterized UA versus urine recheck after she returns from her study abroad experience.  Is not feasible for her to return to clinic for repeat UA prior to her departure.  She declines catheterized UA.  Will plan for repeat UA after she returns from the semester.  Will also obtain standard and atypical cultures out of an abundance of caution, though she is asymptomatic at this time and I suspicion for infection is low. - Urinalysis, Complete - CULTURE, URINE COMPREHENSIVE - Mycoplasma / ureaplasma culture - Urinalysis, Complete; Future  Return in about 4 months (around 11/11/2022) for Lab visit for UA, Will call with results.  Debroah Loop, PA-C  [facility] [address] [place], [place] [address] [phone]

## 2022-07-16 LAB — CULTURE, URINE COMPREHENSIVE

## 2022-07-19 LAB — MYCOPLASMA / UREAPLASMA CULTURE
Mycoplasma hominis Culture: NEGATIVE
Ureaplasma urealyticum: NEGATIVE

## 2022-11-16 ENCOUNTER — Other Ambulatory Visit: Payer: Medicaid Other

## 2022-11-16 DIAGNOSIS — R3129 Other microscopic hematuria: Secondary | ICD-10-CM

## 2022-11-16 LAB — URINALYSIS, COMPLETE
Bilirubin, UA: NEGATIVE
Glucose, UA: NEGATIVE
Ketones, UA: NEGATIVE
Nitrite, UA: NEGATIVE
Protein,UA: NEGATIVE
Specific Gravity, UA: 1.025 (ref 1.005–1.030)
Urobilinogen, Ur: 0.2 mg/dL (ref 0.2–1.0)
pH, UA: 5.5 (ref 5.0–7.5)

## 2022-11-16 LAB — MICROSCOPIC EXAMINATION

## 2022-11-21 LAB — CULTURE, URINE COMPREHENSIVE

## 2023-03-29 ENCOUNTER — Encounter: Payer: Self-pay | Admitting: Adult Health

## 2023-03-29 DIAGNOSIS — S61309A Unspecified open wound of unspecified finger with damage to nail, initial encounter: Secondary | ICD-10-CM | POA: Diagnosis not present

## 2023-03-29 MED ORDER — SULFAMETHOXAZOLE-TRIMETHOPRIM 800-160 MG PO TABS: 1.0000 | ORAL_TABLET | Freq: Two times a day (BID) | ORAL | 0 refills | Status: DC

## 2023-03-30 ENCOUNTER — Encounter: Payer: Self-pay | Admitting: Physician Assistant

## 2024-03-07 ENCOUNTER — Ambulatory Visit

## 2024-03-07 DIAGNOSIS — Z113 Encounter for screening for infections with a predominantly sexual mode of transmission: Secondary | ICD-10-CM

## 2024-03-07 LAB — HM HIV SCREENING LAB: HM HIV Screening: NEGATIVE

## 2024-03-07 LAB — WET PREP FOR TRICH, YEAST, CLUE
Clue Cell Exam: POSITIVE — AB
Trichomonas Exam: NEGATIVE
Yeast Exam: NEGATIVE

## 2024-03-07 NOTE — Progress Notes (Signed)
 [facility] Department STI clinic [address] [place] [address] Main phone: [phone]  STI screening visit  Subjective:  Kimberly Park is a 23 y.o. female being seen today for an STI screening visit. The patient reports they do not have symptoms.  Patient reports that they do not desire a pregnancy in the next year.   They reported they are not interested in discussing contraception today.    Patient's last menstrual period was 03/04/2024 (exact date).  Patient has the following medical conditions:  Patient Active Problem List   Diagnosis Date Noted   Appendicitis 09/26/2020   Acute perforated appendicitis 09/25/2020   Chief Complaint  Patient presents with   SEXUALLY TRANSMITTED DISEASE    HPI Patient reports desire for asymptomatic STI testing. No contacts. No concerns. She has not had a pap yet since turning 22. Will schedule visit for this in near future. No desire to discuss contraception. Uses condoms most times.  See flowsheet for further details and programmatic requirements Hyperlink available at the top of the signed note in blue.  Flow sheet content below:  Pregnancy Intention Screening Does the patient want to become pregnant in the next year?: No Does the patient's partner want to become pregnant in the next year?: No Would the patient like to discuss contraceptive options today?: No Reason For STD Screen STD Screening: Is asymptomatic Have you ever had an STD?: No History of Antibiotic use in the past 2 weeks?: No STD Symptoms Denies all: Yes Risk Factors for Hep B Household, sexual, or needle sharing contact of a person infected with Hep B: No Sexual contact with a person who uses drugs not as prescribed?: No Currently or Ever used drugs not as prescribed: No HIV Positive: No PRep Patient: No Men who have sex with men: No Have Hepatitis C: No History of Incarceration: No History of Homeslessness?:  No Anal sex following anal drug use?: No Risk Factors for Hep C Currently using drugs not as prescribed: No Sexual partner(s) currently using drugs as not prescribed: No History of drug use: No HIV Positive: No People with a history of incarceration: No People born between the years of 84 and 6: No Advise Advised client to quit or stay quit. : Yes Abuse History Has patient ever been abused physically?: No Has patient ever been abused sexually?: No Does patient feel they have a problem with Anxiety?: No Does patient feel they have a problem with Depression?: No Counseling Patient counseled to use condoms with all sex: Condoms declined RTC in 2-3 weeks for test results: Yes Clinic will call if test results abnormal before test result appt.: Yes Test results given to patient Patient counseled to use condoms with all sex: Condoms declined   Screening for MPX risk: Does the patient have an unexplained rash? No Is the patient MSM? No Does the patient endorse multiple sex partners or anonymous sex partners? No Did the patient have close or sexual contact with a person diagnosed with MPX? No Has the patient traveled outside the [place]  where MPX is endemic? No Is there a high clinical suspicion for MPX-- evidenced by one of the following No  -Unlikely to be chickenpox  -Lymphadenopathy  -Rash that present in same phase of evolution on any given body part  Screenings: Last HIV test per patient/review of record was No results found for: HMHIVSCREEN  Lab Results  Component Value Date   HIV Non Reactive 09/25/2020  Last HEPC test per patient/review of record was No results found for: HMHEPCSCREEN No components found for: HEPC   Last HEPB test per patient/review of record was No components found for: HMHEPBSCREEN   Patient reports last pap was:   No results found for: SPECADGYN No Cervical Cancer Screening results to display.  Immunization history:  Immunization  History  Administered Date(s) Administered   PFIZER(Purple Top)SARS-COV-2 Vaccination 11/21/2019, 12/12/2019    The following portions of the patient's history were reviewed and updated as appropriate: allergies, current medications, past medical history, past social history, past surgical history and problem list.  Objective:  There were no vitals filed for this visit.  Physical Exam Vitals and nursing note reviewed. Exam conducted with a chaperone present Brett Orange).  Constitutional:      Appearance: Normal appearance.  HENT:     Head: Normocephalic and atraumatic.     Mouth/Throat:     Mouth: Mucous membranes are moist.     Pharynx: Oropharynx is clear. No oropharyngeal exudate or posterior oropharyngeal erythema.  Pulmonary:     Effort: Pulmonary effort is normal.  Abdominal:     General: Abdomen is flat.     Palpations: There is no mass.     Tenderness: There is no abdominal tenderness. There is no rebound.  Genitourinary:    General: Normal vulva.     Exam position: Lithotomy position.     Pubic Area: No rash or pubic lice.      Labia:        Right: No rash or lesion.        Left: No rash or lesion.      Comments: Swabbed without use of speculum Lymphadenopathy:     Head:     Right side of head: No preauricular or posterior auricular adenopathy.     Left side of head: No preauricular or posterior auricular adenopathy.     Cervical: No cervical adenopathy.     Upper Body:     Right upper body: No supraclavicular, axillary or epitrochlear adenopathy.     Left upper body: No supraclavicular, axillary or epitrochlear adenopathy.  Skin:    General: Skin is warm and dry.     Findings: No rash.  Neurological:     Mental Status: She is alert and oriented to person, place, and time.    Assessment and Plan:  Kimberly Park is a 23 y.o. female presenting to the [facility] Department for STI screening  1. Screening for venereal disease (Primary)  -  Chlamydia/Gonorrhea [place] [facility] - WET PREP FOR TRICH, YEAST, CLUE - HIV [employer] [facility] - Syphilis Serology, [place] [facility] - Gonococcus culture  Patient accepted the following screenings: oral GC culture, vaginal CT/GC swab, vaginal wet prep, HIV, and RPR Patient meets criteria for HepB screening? No. Ordered? no Patient meets criteria for HepC screening? No. Ordered? no  Treat wet prep per standing order Discussed time line for [facility] results and that patient will be called with positive results and encouraged patient to call if she had not heard in 2 weeks.  Counseled to return or seek care for continued or worsening symptoms Recommended repeat testing in 3 months with positive results. Recommended condom use with all sex for STI prevention.   Patient is currently using condoms to prevent pregnancy.    Return for pap next when able.  No future appointments.  Damien FORBES Satchel, NP

## 2024-03-12 LAB — GONOCOCCUS CULTURE
